# Patient Record
Sex: Male | Born: 1980 | Race: White | Hispanic: No | Marital: Single | Smoking: Never smoker
Health system: Southern US, Community
[De-identification: ages and names within clinical notes are randomized; demographics above are authoritative.]

## PROBLEM LIST (undated history)

## (undated) HISTORY — PX: KNEE SURGERY: SHX244

## (undated) HISTORY — PX: TONSILLECTOMY: SUR1361

## (undated) HISTORY — PX: LEG SURGERY: SHX1003

---

## 1998-01-22 ENCOUNTER — Emergency Department (HOSPITAL_COMMUNITY): Admission: EM | Admit: 1998-01-22 | Discharge: 1998-01-22 | Payer: Self-pay | Admitting: Emergency Medicine

## 1998-01-30 ENCOUNTER — Emergency Department (HOSPITAL_COMMUNITY): Admission: EM | Admit: 1998-01-30 | Discharge: 1998-01-30 | Payer: Self-pay | Admitting: *Deleted

## 1998-04-29 ENCOUNTER — Ambulatory Visit (HOSPITAL_BASED_OUTPATIENT_CLINIC_OR_DEPARTMENT_OTHER): Admission: RE | Admit: 1998-04-29 | Discharge: 1998-04-30 | Payer: Self-pay | Admitting: *Deleted

## 2005-09-14 ENCOUNTER — Encounter: Payer: Self-pay | Admitting: Emergency Medicine

## 2007-02-22 ENCOUNTER — Emergency Department (HOSPITAL_COMMUNITY): Admission: AC | Admit: 2007-02-22 | Discharge: 2007-02-22 | Payer: Self-pay

## 2008-08-16 ENCOUNTER — Emergency Department (HOSPITAL_COMMUNITY): Admission: EM | Admit: 2008-08-16 | Discharge: 2008-08-16 | Payer: Self-pay | Admitting: Emergency Medicine

## 2009-06-07 ENCOUNTER — Emergency Department (HOSPITAL_BASED_OUTPATIENT_CLINIC_OR_DEPARTMENT_OTHER): Admission: EM | Admit: 2009-06-07 | Discharge: 2009-06-07 | Payer: Self-pay | Admitting: Emergency Medicine

## 2009-06-07 ENCOUNTER — Ambulatory Visit: Payer: Self-pay | Admitting: Diagnostic Radiology

## 2010-08-25 LAB — POCT I-STAT, CHEM 8
Calcium, Ion: 1.06 mmol/L — ABNORMAL LOW (ref 1.12–1.32)
Glucose, Bld: 119 mg/dL — ABNORMAL HIGH (ref 70–99)
Sodium: 142 mEq/L (ref 135–145)
TCO2: 19 mmol/L (ref 0–100)

## 2010-08-25 LAB — DIFFERENTIAL
Basophils Absolute: 0 10*3/uL (ref 0.0–0.1)
Basophils Relative: 0 % (ref 0–1)
Eosinophils Absolute: 0 10*3/uL (ref 0.0–0.7)
Eosinophils Relative: 0 % (ref 0–5)
Lymphocytes Relative: 18 % (ref 12–46)
Lymphs Abs: 1.6 10*3/uL (ref 0.7–4.0)
Monocytes Absolute: 0.5 10*3/uL (ref 0.1–1.0)
Monocytes Relative: 5 % (ref 3–12)
Neutrophils Relative %: 76 % (ref 43–77)

## 2010-08-25 LAB — CBC
Hemoglobin: 16.2 g/dL (ref 13.0–17.0)
MCHC: 34.4 g/dL (ref 30.0–36.0)
MCV: 89 fL (ref 78.0–100.0)
Platelets: 215 10*3/uL (ref 150–400)
WBC: 8.7 10*3/uL (ref 4.0–10.5)

## 2010-08-25 LAB — RAPID URINE DRUG SCREEN, HOSP PERFORMED
Amphetamines: NOT DETECTED
Tetrahydrocannabinol: NOT DETECTED

## 2011-02-24 LAB — I-STAT 8, (EC8 V) (CONVERTED LAB)
BUN: 16
Chloride: 105
Glucose, Bld: 132 — ABNORMAL HIGH
Hemoglobin: 17
Operator id: 294501
Potassium: 3.5
Sodium: 139
TCO2: 22
pH, Ven: 7.517 — ABNORMAL HIGH

## 2011-02-24 LAB — BASIC METABOLIC PANEL WITH GFR
BUN: 16
CO2: 23
Calcium: 9.3
Chloride: 105
Potassium: 3.5

## 2011-02-24 LAB — BASIC METABOLIC PANEL
Creatinine, Ser: 1.07
GFR calc Af Amer: >60
GFR calc non Af Amer: >60
Glucose, Bld: 130 — ABNORMAL HIGH
Sodium: 139

## 2011-02-24 LAB — CBC
HCT: 45.9
Hemoglobin: 15.8
MCHC: 34.5
MCV: 89
Platelets: 267
RBC: 5.16
RDW: 12.9
WBC: 11.8 — ABNORMAL HIGH

## 2011-02-24 LAB — APTT: aPTT: 24

## 2011-02-24 LAB — POCT I-STAT 3, ART BLOOD GAS (G3+)
Acid-base deficit: 1
O2 Saturation: 96
Operator id: 296031
pCO2 arterial: 50.1 — ABNORMAL HIGH

## 2011-02-24 LAB — PROTIME-INR
INR: 1
Prothrombin Time: 13

## 2017-08-15 ENCOUNTER — Encounter (HOSPITAL_BASED_OUTPATIENT_CLINIC_OR_DEPARTMENT_OTHER): Payer: Self-pay | Admitting: *Deleted

## 2017-08-15 ENCOUNTER — Emergency Department (HOSPITAL_BASED_OUTPATIENT_CLINIC_OR_DEPARTMENT_OTHER)
Admission: EM | Admit: 2017-08-15 | Discharge: 2017-08-15 | Disposition: A | Discharge status: Home / Self Care | Payer: No Typology Code available for payment source | Attending: Emergency Medicine | Admitting: Emergency Medicine

## 2017-08-15 ENCOUNTER — Other Ambulatory Visit: Payer: Self-pay

## 2017-08-15 DIAGNOSIS — S199XXA Unspecified injury of neck, initial encounter: Secondary | ICD-10-CM | POA: Diagnosis present

## 2017-08-15 DIAGNOSIS — Y9389 Activity, other specified: Secondary | ICD-10-CM | POA: Diagnosis not present

## 2017-08-15 DIAGNOSIS — S161XXA Strain of muscle, fascia and tendon at neck level, initial encounter: Secondary | ICD-10-CM | POA: Insufficient documentation

## 2017-08-15 DIAGNOSIS — S8002XA Contusion of left knee, initial encounter: Secondary | ICD-10-CM

## 2017-08-15 DIAGNOSIS — Y92410 Unspecified street and highway as the place of occurrence of the external cause: Secondary | ICD-10-CM | POA: Insufficient documentation

## 2017-08-15 DIAGNOSIS — Y999 Unspecified external cause status: Secondary | ICD-10-CM | POA: Diagnosis not present

## 2017-08-15 MED ORDER — IBUPROFEN 800 MG PO TABS: 800.0000 mg | ORAL_TABLET | Freq: Three times a day (TID) | ORAL | 0 refills | Status: AC | PRN
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
## 2017-08-15 NOTE — ED Notes (Signed)
ED Provider at bedside. 
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
## 2019-09-30 ENCOUNTER — Emergency Department (HOSPITAL_BASED_OUTPATIENT_CLINIC_OR_DEPARTMENT_OTHER): Payer: 59

## 2019-09-30 ENCOUNTER — Emergency Department (HOSPITAL_BASED_OUTPATIENT_CLINIC_OR_DEPARTMENT_OTHER)
Admission: EM | Admit: 2019-09-30 | Discharge: 2019-09-30 | Disposition: A | Discharge status: Home / Self Care | Payer: 59 | Attending: Emergency Medicine | Admitting: Emergency Medicine

## 2019-09-30 ENCOUNTER — Encounter (HOSPITAL_BASED_OUTPATIENT_CLINIC_OR_DEPARTMENT_OTHER): Payer: Self-pay | Admitting: *Deleted

## 2019-09-30 ENCOUNTER — Other Ambulatory Visit: Payer: Self-pay

## 2019-09-30 DIAGNOSIS — Y999 Unspecified external cause status: Secondary | ICD-10-CM | POA: Diagnosis not present

## 2019-09-30 DIAGNOSIS — Y9389 Activity, other specified: Secondary | ICD-10-CM | POA: Diagnosis not present

## 2019-09-30 DIAGNOSIS — Y92410 Unspecified street and highway as the place of occurrence of the external cause: Secondary | ICD-10-CM | POA: Insufficient documentation

## 2019-09-30 DIAGNOSIS — S161XXA Strain of muscle, fascia and tendon at neck level, initial encounter: Secondary | ICD-10-CM | POA: Insufficient documentation

## 2019-09-30 DIAGNOSIS — S0990XA Unspecified injury of head, initial encounter: Secondary | ICD-10-CM | POA: Diagnosis present
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
## 2019-10-15 ENCOUNTER — Ambulatory Visit (INDEPENDENT_AMBULATORY_CARE_PROVIDER_SITE_OTHER): Payer: 59 | Admitting: Family Medicine

## 2019-10-15 ENCOUNTER — Encounter: Payer: Self-pay | Admitting: Family Medicine

## 2019-10-15 ENCOUNTER — Other Ambulatory Visit: Payer: Self-pay

## 2019-10-15 DIAGNOSIS — M546 Pain in thoracic spine: Secondary | ICD-10-CM | POA: Diagnosis not present

## 2019-10-15 DIAGNOSIS — M542 Cervicalgia: Secondary | ICD-10-CM | POA: Diagnosis not present

## 2019-10-15 MED ORDER — MELOXICAM 15 MG PO TABS: 7.5000 mg | ORAL_TABLET | Freq: Every day | ORAL | 6 refills | Status: AC | PRN
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
## 2019-10-30 ENCOUNTER — Other Ambulatory Visit: Payer: Self-pay

## 2019-10-30 ENCOUNTER — Ambulatory Visit (INDEPENDENT_AMBULATORY_CARE_PROVIDER_SITE_OTHER): Payer: 59 | Admitting: Family Medicine

## 2019-10-30 ENCOUNTER — Encounter: Payer: Self-pay | Admitting: Family Medicine

## 2019-10-30 DIAGNOSIS — M546 Pain in thoracic spine: Secondary | ICD-10-CM

## 2019-10-30 NOTE — Progress Notes (Signed)
He was here today with a misunderstanding that he would be able to get his thoracic MRI scan done here.  Unfortunately we do not have that capability.  He has been approved for his MRI scan so we gave him the number to [facility] imaging and he will call to make an appointment and then follow-up after that.

## 2019-11-12 ENCOUNTER — Telehealth: Payer: Self-pay | Admitting: Family Medicine

## 2019-11-12 NOTE — Telephone Encounter (Signed)
MRI does not show a thoracic compression fracture.  He has mild degenerative changes in the discs from T7 through T10, but no disc ruptures and no pinched nerves.

## 2019-11-14 NOTE — Telephone Encounter (Signed)
I called and advised the patient of his MRI results. The patient said he will get back to seeing the chiropractor, as he continues to have burning pain between his shoulder blades + he was not interested in an ESI, at least at this time. Advised him to let us know if he does not improve, or if symptoms worsen.

## 2019-12-02 ENCOUNTER — Other Ambulatory Visit: Payer: 59
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
